# Patient Record
Sex: Female | Born: 1970 | Race: White | Hispanic: No | Marital: Married | Smoking: Never smoker
Health system: Southern US, Community
[De-identification: ages and names within clinical notes are randomized; demographics above are authoritative.]

## PROBLEM LIST (undated history)

## (undated) DIAGNOSIS — C9590 Leukemia, unspecified not having achieved remission: Secondary | ICD-10-CM

---

## 2014-04-29 ENCOUNTER — Emergency Department (HOSPITAL_COMMUNITY)
Admission: EM | Admit: 2014-04-29 | Discharge: 2014-04-29 | Disposition: A | Discharge status: Home / Self Care | Payer: No Typology Code available for payment source | Attending: Emergency Medicine | Admitting: Emergency Medicine

## 2014-04-29 ENCOUNTER — Emergency Department (HOSPITAL_COMMUNITY): Payer: No Typology Code available for payment source

## 2014-04-29 ENCOUNTER — Encounter (HOSPITAL_COMMUNITY): Payer: Self-pay | Admitting: Emergency Medicine

## 2014-04-29 DIAGNOSIS — Z8589 Personal history of malignant neoplasm of other organs and systems: Secondary | ICD-10-CM | POA: Diagnosis not present

## 2014-04-29 DIAGNOSIS — K5732 Diverticulitis of large intestine without perforation or abscess without bleeding: Secondary | ICD-10-CM | POA: Insufficient documentation

## 2014-04-29 DIAGNOSIS — Z3202 Encounter for pregnancy test, result negative: Secondary | ICD-10-CM | POA: Insufficient documentation

## 2014-04-29 DIAGNOSIS — R1031 Right lower quadrant pain: Secondary | ICD-10-CM | POA: Diagnosis present

## 2014-04-29 DIAGNOSIS — R000 Tachycardia, unspecified: Secondary | ICD-10-CM | POA: Diagnosis not present

## 2014-04-29 DIAGNOSIS — R509 Fever, unspecified: Secondary | ICD-10-CM | POA: Insufficient documentation

## 2014-04-29 HISTORY — DX: Leukemia, unspecified not having achieved remission: C95.90

## 2014-04-29 LAB — COMPREHENSIVE METABOLIC PANEL
ALT: 24 U/L (ref 0–35)
ANION GAP: 10 (ref 5–15)
AST: 23 U/L (ref 0–37)
Albumin: 4.1 g/dL (ref 3.5–5.2)
Alkaline Phosphatase: 65 U/L (ref 39–117)
BILIRUBIN TOTAL: 0.6 mg/dL (ref 0.3–1.2)
BUN: 7 mg/dL (ref 6–23)
CHLORIDE: 101 mmol/L (ref 96–112)
CO2: 27 mmol/L (ref 19–32)
Calcium: 9.7 mg/dL (ref 8.4–10.5)
Creatinine, Ser: 0.77 mg/dL (ref 0.50–1.10)
GFR calc non Af Amer: >90 mL/min (ref >90)
GLUCOSE: 103 mg/dL — AB (ref 70–99)
POTASSIUM: 4 mmol/L (ref 3.5–5.1)
SODIUM: 138 mmol/L (ref 135–145)
Total Protein: 7.3 g/dL (ref 6.0–8.3)

## 2014-04-29 LAB — CBC WITH DIFFERENTIAL/PLATELET
BASOS ABS: 0 10*3/uL (ref 0.0–0.1)
BASOS PCT: 0 % (ref 0–1)
Eosinophils Absolute: 0.1 10*3/uL (ref 0.0–0.7)
Eosinophils Relative: 0 % (ref 0–5)
HCT: 41.2 % (ref 36.0–46.0)
Hemoglobin: 13.9 g/dL (ref 12.0–15.0)
Lymphocytes Relative: 13 % (ref 12–46)
Lymphs Abs: 1.7 10*3/uL (ref 0.7–4.0)
MCH: 29.3 pg (ref 26.0–34.0)
MCHC: 33.7 g/dL (ref 30.0–36.0)
MCV: 86.7 fL (ref 78.0–100.0)
MONO ABS: 0.5 10*3/uL (ref 0.1–1.0)
Monocytes Relative: 4 % (ref 3–12)
NEUTROS ABS: 11.4 10*3/uL — AB (ref 1.7–7.7)
Neutrophils Relative %: 83 % — ABNORMAL HIGH (ref 43–77)
Platelets: 226 10*3/uL (ref 150–400)
RBC: 4.75 MIL/uL (ref 3.87–5.11)
RDW: 13.6 % (ref 11.5–15.5)
WBC: 13.7 10*3/uL — ABNORMAL HIGH (ref 4.0–10.5)

## 2014-04-29 LAB — URINALYSIS, ROUTINE W REFLEX MICROSCOPIC
Bilirubin Urine: NEGATIVE
Glucose, UA: NEGATIVE mg/dL
Hgb urine dipstick: NEGATIVE
KETONES UR: NEGATIVE mg/dL
LEUKOCYTES UA: NEGATIVE
NITRITE: NEGATIVE
Protein, ur: NEGATIVE mg/dL
Specific Gravity, Urine: 1.016 (ref 1.005–1.030)
UROBILINOGEN UA: 0.2 mg/dL (ref 0.0–1.0)
pH: 5 (ref 5.0–8.0)

## 2014-04-29 LAB — PREGNANCY, URINE: Preg Test, Ur: NEGATIVE

## 2014-04-29 MED ORDER — CIPROFLOXACIN HCL 500 MG PO TABS: 500.0000 mg | ORAL_TABLET | Freq: Two times a day (BID) | ORAL | Status: AC

## 2014-04-29 MED ORDER — CIPROFLOXACIN HCL 500 MG PO TABS
500.0000 mg | ORAL_TABLET | Freq: Once | ORAL | Status: AC
  Administered 2014-04-29: 500 mg via ORAL
  Filled 2014-04-29: qty 1

## 2014-04-29 MED ORDER — METRONIDAZOLE 500 MG PO TABS
500.0000 mg | ORAL_TABLET | Freq: Once | ORAL | Status: AC
  Administered 2014-04-29: 500 mg via ORAL
  Filled 2014-04-29: qty 1

## 2014-04-29 MED ORDER — MORPHINE SULFATE 4 MG/ML IJ SOLN: 4.0000 mg | Freq: Once | INTRAMUSCULAR | Status: DC

## 2014-04-29 MED ORDER — IOHEXOL 300 MG/ML  SOLN
100.0000 mL | Freq: Once | INTRAMUSCULAR | Status: AC | PRN
  Administered 2014-04-29: 100 mL via INTRAVENOUS

## 2014-04-29 MED ORDER — ONDANSETRON HCL 4 MG/2ML IJ SOLN
4.0000 mg | Freq: Once | INTRAMUSCULAR | Status: AC
  Administered 2014-04-29: 4 mg via INTRAVENOUS
  Filled 2014-04-29: qty 2

## 2014-04-29 MED ORDER — MORPHINE SULFATE 4 MG/ML IJ SOLN
4.0000 mg | INTRAMUSCULAR | Status: DC | PRN
  Administered 2014-04-29: 4 mg via INTRAVENOUS
  Filled 2014-04-29: qty 1

## 2014-04-29 MED ORDER — HYDROMORPHONE HCL 1 MG/ML IJ SOLN
0.5000 mg | Freq: Once | INTRAMUSCULAR | Status: AC
  Administered 2014-04-29: 0.5 mg via INTRAVENOUS
  Filled 2014-04-29: qty 1

## 2014-04-29 MED ORDER — SODIUM CHLORIDE 0.9 % IV BOLUS (SEPSIS)
1000.0000 mL | Freq: Once | INTRAVENOUS | Status: AC
  Administered 2014-04-29: 1000 mL via INTRAVENOUS

## 2014-04-29 MED ORDER — IOHEXOL 300 MG/ML  SOLN: 25.0000 mL | Freq: Once | INTRAMUSCULAR | Status: DC | PRN

## 2014-04-29 MED ORDER — METRONIDAZOLE 500 MG PO TABS: 500.0000 mg | ORAL_TABLET | Freq: Three times a day (TID) | ORAL | Status: AC

## 2014-04-29 MED ORDER — OXYCODONE-ACETAMINOPHEN 5-325 MG PO TABS: 1.0000 | ORAL_TABLET | ORAL | Status: AC | PRN

## 2014-04-29 NOTE — ED Provider Notes (Signed)
CSN: [id number]     Arrival date & time 04/29/14  1105 History   First MD Initiated Contact with Patient 04/29/14 1238     Chief Complaint  Patient presents with  . Abdominal Pain     (Consider location/radiation/quality/duration/timing/severity/associated sxs/prior Treatment) HPI Darlene Stokes is a 44 y.o. female with hx of leukemia, presents to ED with complaint of abdominal pain. Patient states pain started gradually yesterday. Pain initially all over abdomen, now in the right lower quadrant. States she is not having any nausea or vomiting but has no appetite. She reports fever up to 100 at home. She denies any diarrhea. Denies any urinary symptoms. No vaginal discharge or bleeding. She has not taken any medications for her symptoms at home. She states pain is sharp, does not radiate, worsened with movement and palpation of her abdomen. Denies a prior history of the same. No prior abdominal surgeries.  Past Medical History  Diagnosis Date  . Leukemia    History reviewed. No pertinent past surgical history. History reviewed. No pertinent family history. History  Substance Use Topics  . Smoking status: Never Smoker   . Smokeless tobacco: Not on file  . Alcohol Use: No   OB History    No data available     Review of Systems  Constitutional: Positive for fever and chills.  Respiratory: Negative for cough, chest tightness and shortness of breath.   Cardiovascular: Negative for chest pain, palpitations and leg swelling.  Gastrointestinal: Positive for abdominal pain. Negative for nausea, vomiting and diarrhea.  Genitourinary: Negative for dysuria, flank pain and pelvic pain.  Musculoskeletal: Negative for myalgias, arthralgias, neck pain and neck stiffness.  Skin: Negative for rash.  Neurological: Negative for dizziness, weakness and headaches.  All other systems reviewed and are negative.     Allergies  Review of patient's allergies indicates no known allergies.  Home  Medications   Prior to Admission medications   Not on File   BP 140/85 mmHg  Pulse 112  Temp(Src) 98.7 F (37.1 C) (Oral)  Resp 18  Ht 5' 6.5" (1.689 m)  Wt 220 lb (99.791 kg)  BMI 34.98 kg/m2  SpO2 98% Physical Exam  Constitutional: She appears well-developed and well-nourished. No distress.  HENT:  Head: Normocephalic.  Eyes: Conjunctivae are normal.  Neck: Neck supple.  Cardiovascular: Normal rate, regular rhythm and normal heart sounds.   Pulmonary/Chest: Effort normal and breath sounds normal. No respiratory distress. She has no wheezes. She has no rales.  Abdominal: Soft. Bowel sounds are normal. She exhibits no distension. There is tenderness. There is no rebound.  RLQ tenderness with guarding.  Musculoskeletal: She exhibits no edema.  Neurological: She is alert.  Skin: Skin is warm and dry.  Psychiatric: She has a normal mood and affect. Her behavior is normal.  Nursing note and vitals reviewed.   ED Course  Procedures (including critical care time) Labs Review Labs Reviewed  CBC WITH DIFFERENTIAL/PLATELET - Abnormal; Notable for the following:    WBC 13.7 (*)    Neutrophils Relative % 83 (*)    Neutro Abs 11.4 (*)    All other components within normal limits  COMPREHENSIVE METABOLIC PANEL - Abnormal; Notable for the following:    Glucose, Bld 103 (*)    All other components within normal limits  URINALYSIS, ROUTINE W REFLEX MICROSCOPIC  PREGNANCY, URINE  POC URINE PREG, ED    Imaging Review Ct Abdomen Pelvis W Contrast  04/29/2014   CLINICAL DATA:  Right lower  quadrant pain since Sunday. Fevers. History of leukemia, in remission.  EXAM: CT ABDOMEN AND PELVIS WITH CONTRAST  TECHNIQUE: Multidetector CT imaging of the abdomen and pelvis was performed using the standard protocol following bolus administration of intravenous contrast.  CONTRAST:  141mL OMNIPAQUE IOHEXOL 300 MG/ML  SOLN  COMPARISON:  None.  FINDINGS: Normal hepatic contour. There is diffuse  decreased attenuation of the hepatic parenchyma on this postcontrast examination suggestive of hepatic steatosis. No discrete hepatic lesions. Normal appearance of the gallbladder given degree distention. No radiopaque gallstones. No ascites.  There is symmetric enhancement and excretion of the bilateral kidneys. No definite renal stones on this postcontrast examination. No discrete renal lesions. No urinary obstruction or perinephric stranding. Normal appearance of the bilateral adrenal glands, pancreas and spleen. Incidental note is made of a small splenule.  Ingested enteric contrast extends to the level of the distal small bowel. There is marked pericecal inflammatory change. This finding is associated with normal appearance of the terminal ileum as well as the retrocecal appendix (best seen on coronal images 57 through 68, series 7). There is a prominent diverticulum involving the anterior inferior aspect of the cecum (axial 75, series 4, coronal image 50, series 7), and as such, findings are worrisome for acute, uncomplicated diverticulitis. There is pericecal inflammatory change without definable/drainable fluid collection. No pneumoperitoneum, pneumatosis or portal venous gas.  Scattered diverticulosis within the sigmoid colon. No evidence of enteric obstruction. The bowel is otherwise normal in course and caliber without discrete area of wall thickening.  Normal caliber the abdominal aorta. The major branch vessels of the abdominal aorta appear widely patent on this non CTA examination. No retroperitoneal, mesenteric, pelvic or inguinal lymphadenopathy.  Normal appearance of the pelvic organs. No discrete adnexal lesion. No free fluid in the pelvic cul-de-sac.  Limited visualization of the lower thorax demonstrates minimal grossly symmetric dependent subpleural ground-glass atelectasis. No discrete focal airspace opacities.  Normal heart size.  No pericardial effusion.  No acute or aggressive osseous  abnormalities.  Small mesenteric fat containing periumbilical hernia. Regional soft tissues appear otherwise normal.  IMPRESSION: 1. Findings worrisome for acute uncomplicated diverticulitis involving the cecum. No evidence of perforation or definable/drainable fluid collection. Normal appearance of the appendix and terminal ileum. No evidence of enteric obstruction. 2. Scattered diverticulosis involving the sigmoid colon without evidence of an additional area of acute diverticulitis. 3. Suspected hepatic steatosis.   Electronically Signed   By: Sandi Mariscal M.D.   On: 04/29/2014 15:48     EKG Interpretation None      MDM   Final diagnoses:  Diverticulitis of large intestine without perforation or abscess without bleeding    patient with right lower quadrant tenderness, some guarding on examination. She is tachycardic, afebrile and emergency department. We'll start IV fluids, pain medications ordered, will get CT abdomen and pelvis for further evaluation to rule out appendicitis.   4:19 PM Patient's CT showing diverticulitis with no complication. Her pain improved with pain medications. Will start on Cipro, Flagyl, follow up with pain medications, Percocet. Return precautions discussed. She will follow with her primary care doctor.  Filed Vitals:   04/29/14 1250 04/29/14 1315 04/29/14 1522 04/29/14 1530  BP: 140/85 119/71 144/93 149/97  Pulse: 112 106 110 108  Temp: 98.7 F (37.1 C)     TempSrc: Oral     Resp: 18  18   Height:      Weight:      SpO2: 98% 94% 90% 92%  Jeannett Senior, PA-C 04/29/14 [place], MD 04/30/14 [phone]

## 2014-04-29 NOTE — Discharge Instructions (Signed)
Your CT of the abdomen showed diverticulitis. Take cipro and flagyl until all gone. Percocet for severe pain. Take a stool softner with this medication to prevent constipation. Follow up with your primary care doctor.    Diverticulitis Diverticulitis is inflammation or infection of small pouches in your colon that form when you have a condition called diverticulosis. The pouches in your colon are called diverticula. Your colon, or large intestine, is where water is absorbed and stool is formed. Complications of diverticulitis can include:  Bleeding.  Severe infection.  Severe pain.  Perforation of your colon.  Obstruction of your colon. CAUSES  Diverticulitis is caused by bacteria. Diverticulitis happens when stool becomes trapped in diverticula. This allows bacteria to grow in the diverticula, which can lead to inflammation and infection. RISK FACTORS People with diverticulosis are at risk for diverticulitis. Eating a diet that does not include enough fiber from fruits and vegetables may make diverticulitis more likely to develop. SYMPTOMS  Symptoms of diverticulitis may include:  Abdominal pain and tenderness. The pain is normally located on the left side of the abdomen, but may occur in other areas.  Fever and chills.  Bloating.  Cramping.  Nausea.  Vomiting.  Constipation.  Diarrhea.  Blood in your stool. DIAGNOSIS  Your health care provider will ask you about your medical history and do a physical exam. You may need to have tests done because many medical conditions can cause the same symptoms as diverticulitis. Tests may include:  Blood tests.  Urine tests.  Imaging tests of the abdomen, including X-rays and CT scans. When your condition is under control, your health care provider may recommend that you have a colonoscopy. A colonoscopy can show how severe your diverticula are and whether something else is causing your symptoms. TREATMENT  Most cases of  diverticulitis are mild and can be treated at home. Treatment may include:  Taking over-the-counter pain medicines.  Following a clear liquid diet.  Taking antibiotic medicines by mouth for 7-10 days. More severe cases may be treated at a hospital. Treatment may include:  Not eating or drinking.  Taking prescription pain medicine.  Receiving antibiotic medicines through an IV tube.  Receiving fluids and nutrition through an IV tube.  Surgery. HOME CARE INSTRUCTIONS   Follow your health care provider's instructions carefully.  Follow a full liquid diet or other diet as directed by your health care provider. After your symptoms improve, your health care provider may tell you to change your diet. He or she may recommend you eat a high-fiber diet. Fruits and vegetables are good sources of fiber. Fiber makes it easier to pass stool.  Take fiber supplements or probiotics as directed by your health care provider.  Only take medicines as directed by your health care provider.  Keep all your follow-up appointments. SEEK MEDICAL CARE IF:   Your pain does not improve.  You have a hard time eating food.  Your bowel movements do not return to normal. SEEK IMMEDIATE MEDICAL CARE IF:   Your pain becomes worse.  Your symptoms do not get better.  Your symptoms suddenly get worse.  You have a fever.  You have repeated vomiting.  You have bloody or black, tarry stools. MAKE SURE YOU:   Understand these instructions.  Will watch your condition.  Will get help right away if you are not doing well or get worse. Document Released: 11/04/2004 Document Revised: 01/30/2013 Document Reviewed: 12/20/2012 [facility] Patient Information 2015 [place], [place]. This information is not intended  to replace advice given to you by your health care provider. Make sure you discuss any questions you have with your health care provider. ° °

## 2014-04-29 NOTE — ED Notes (Signed)
Pt c/o RLQ pain with some fever x 2 days

## 2016-07-03 IMAGING — CT CT ABD-PELV W/ CM
2 of 7 series · 12 of 46 positions shown, 14 images · IV contrast (Omni 300)
Comparison: None.

CLINICAL DATA: Right lower quadrant pain since [REDACTED]. Fevers.
History of leukemia, in remission.

EXAM:
CT ABDOMEN AND PELVIS WITH CONTRAST
TECHNIQUE: Multidetector CT imaging of the abdomen and pelvis was performed
using the standard protocol following bolus administration of
intravenous contrast.
CONTRAST:  100mL OMNIPAQUE IOHEXOL 300 MG/ML  SOLN

[Series 2: abd/ pelvis 5.0 i30f 1 · axial · 0.70mm/px · z∈[+756,+1141]mm · 9 of 97 slices shown, 11 images]
[im 10/97  soft-tissue]
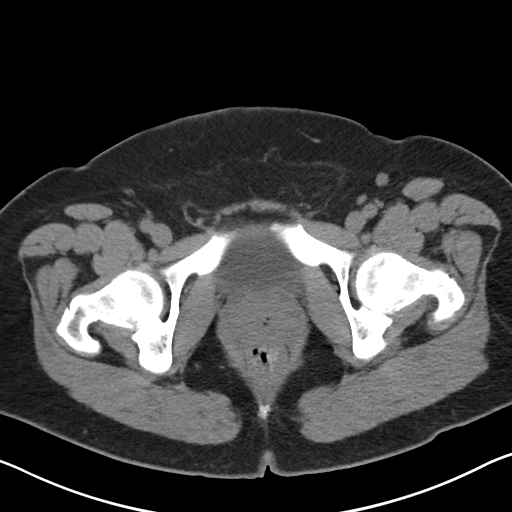
[im 10/97  bone]
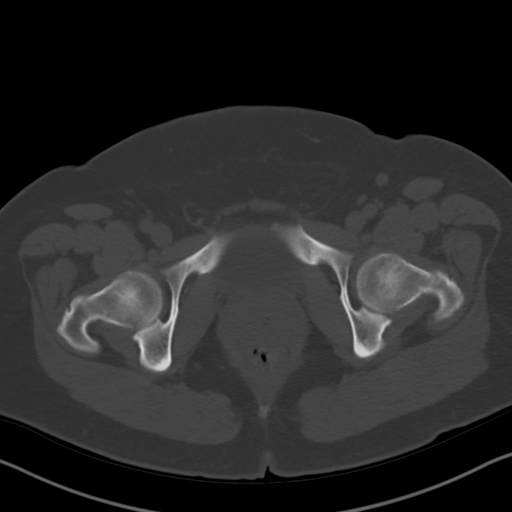
[im 20/97  soft-tissue]
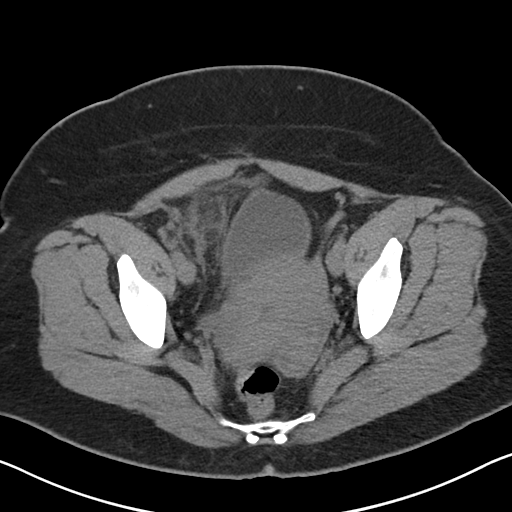
[im 29/97  soft-tissue]
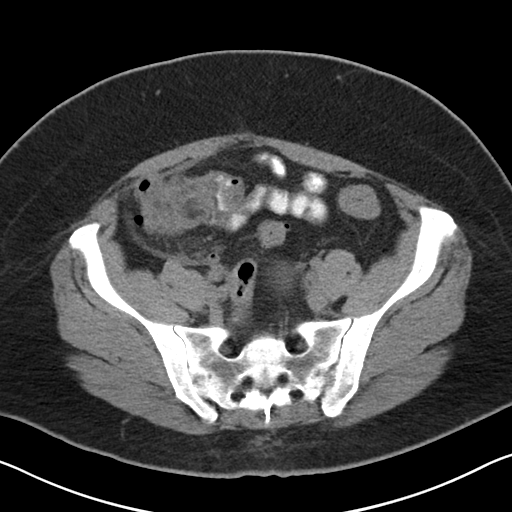
[im 39/97  soft-tissue]
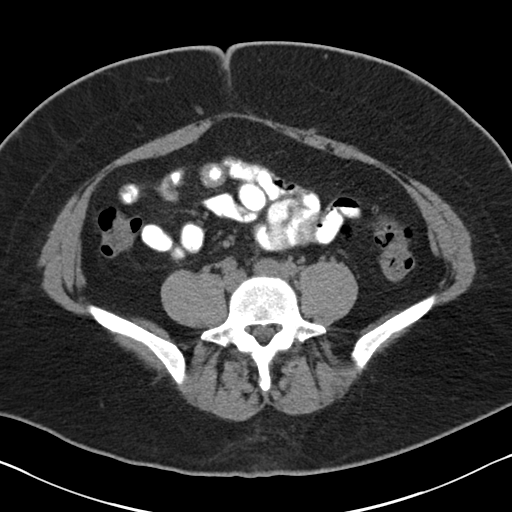
[im 49/97  soft-tissue]
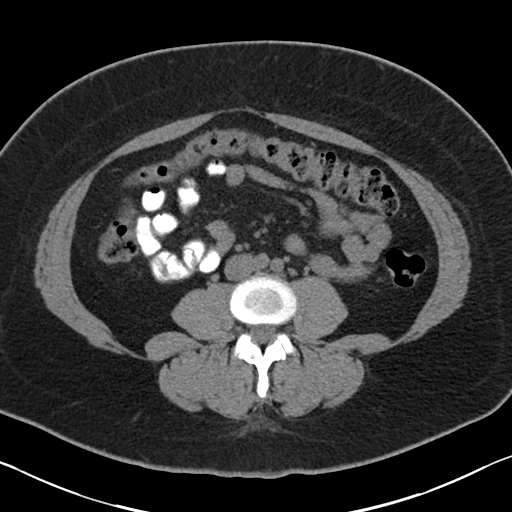
[im 58/97  soft-tissue]
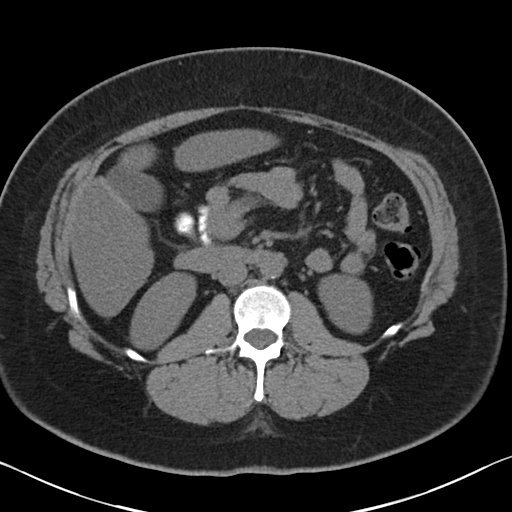
[im 68/97  soft-tissue]
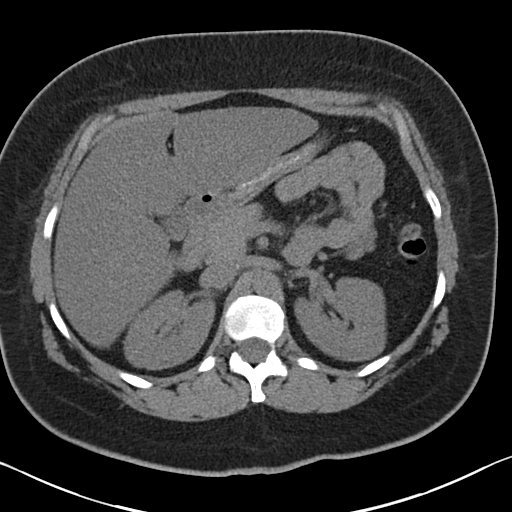
[im 77/97  soft-tissue]
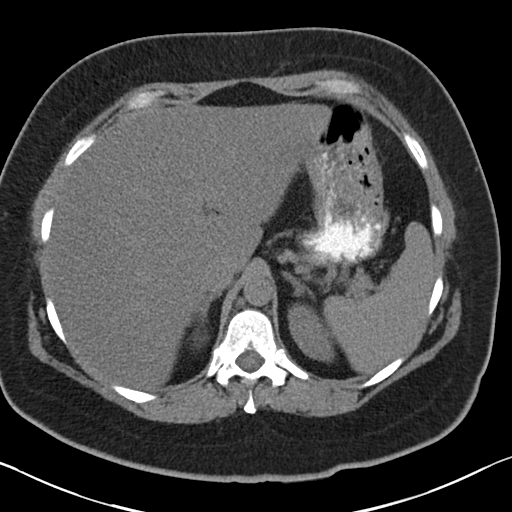
[im 87/97  soft-tissue]
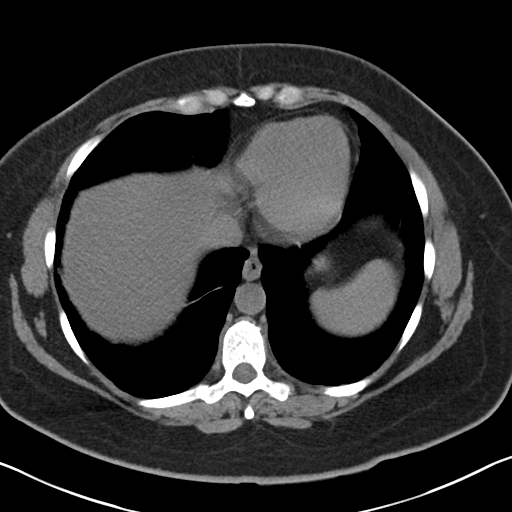
[im 87/97  bone]
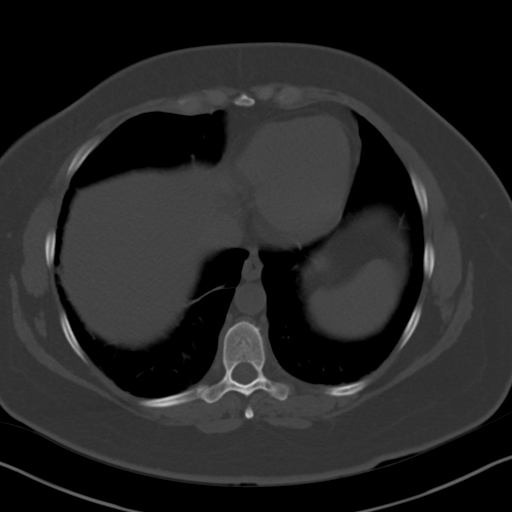

[Series 7: coronals · coronal · 0.70mm/px · 3 of 129 slices shown]
[im 43/129  soft-tissue]
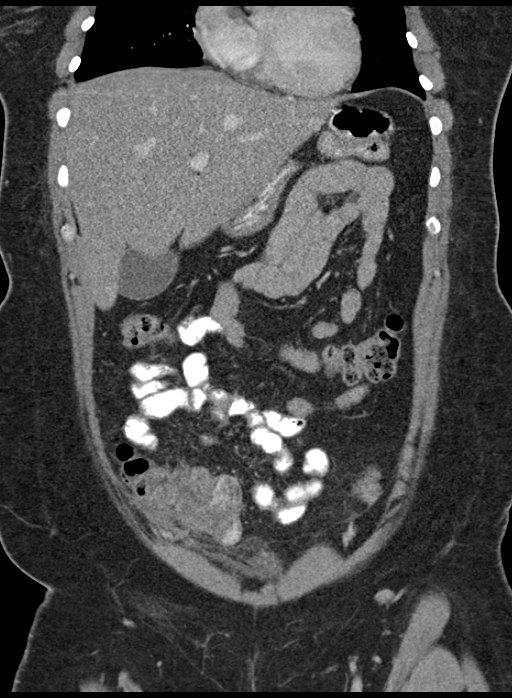
[im 57/129  soft-tissue]
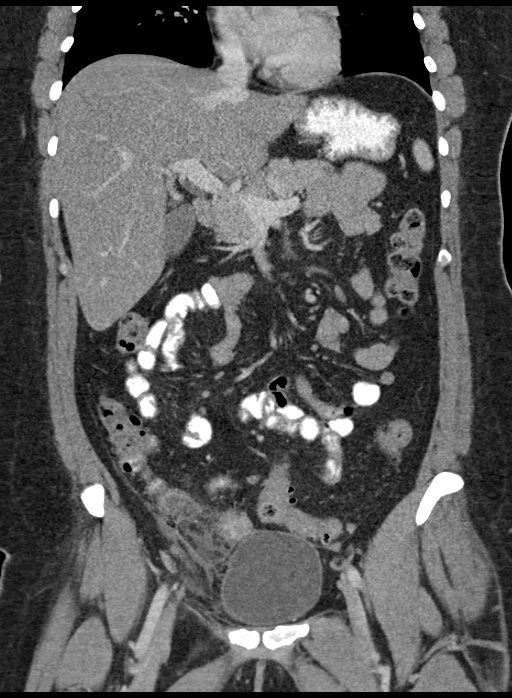
[im 72/129  soft-tissue]
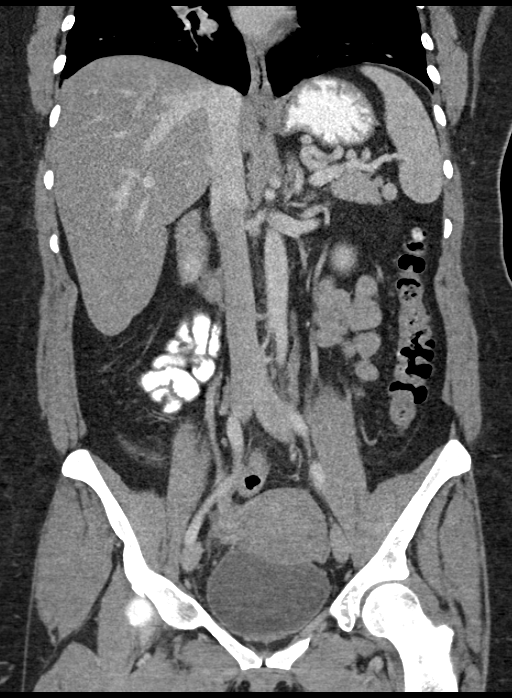

[12 of 46 positions shown; findings below may reference images not displayed]

FINDINGS: Normal hepatic contour. There is diffuse decreased attenuation of
the hepatic parenchyma on this postcontrast examination suggestive
of hepatic steatosis. No discrete hepatic lesions. Normal appearance
of the gallbladder given degree distention. No radiopaque
gallstones. No ascites.

There is symmetric enhancement and excretion of the bilateral
kidneys. No definite renal stones on this postcontrast examination.
No discrete renal lesions. No urinary obstruction or perinephric
stranding. Normal appearance of the bilateral adrenal glands,
pancreas and spleen. Incidental note is made of a small splenule.

Ingested enteric contrast extends to the level of the distal small
bowel. There is marked pericecal inflammatory change. This finding
is associated with normal appearance of the terminal ileum as well
as the retrocecal appendix (best seen on coronal images 57 through
68, series 7). There is a prominent diverticulum involving the
anterior inferior aspect of the cecum (axial 75, series 4, coronal
image 50, series 7), and as such, findings are worrisome for acute,
uncomplicated diverticulitis. There is pericecal inflammatory change
without definable/drainable fluid collection. No pneumoperitoneum,
pneumatosis or portal venous gas.

Scattered diverticulosis within the sigmoid colon. No evidence of
enteric obstruction. The bowel is otherwise normal in course and
caliber without discrete area of wall thickening.

Normal caliber the abdominal aorta. The major branch vessels of the
abdominal aorta appear widely patent on this non CTA examination. No
retroperitoneal, mesenteric, pelvic or inguinal lymphadenopathy.

Normal appearance of the pelvic organs. No discrete adnexal lesion.
No free fluid in the pelvic cul-de-sac.

Limited visualization of the lower thorax demonstrates minimal
grossly symmetric dependent subpleural ground-glass atelectasis. No
discrete focal airspace opacities.

Normal heart size.  No pericardial effusion.

No acute or aggressive osseous abnormalities.

Small mesenteric fat containing periumbilical hernia. Regional soft
tissues appear otherwise normal.
IMPRESSION: 1. Findings worrisome for acute uncomplicated diverticulitis
involving the cecum. No evidence of perforation or
definable/drainable fluid collection. Normal appearance of the
appendix and terminal ileum. No evidence of enteric obstruction.
2. Scattered diverticulosis involving the sigmoid colon without
evidence of an additional area of acute diverticulitis.
3. Suspected hepatic steatosis.
